# Patient Record
Sex: Male | Born: 1980 | Hispanic: No | Marital: Single | Smoking: Never smoker
Health system: Southern US, Community
[De-identification: ages and names within clinical notes are randomized; demographics above are authoritative.]

## PROBLEM LIST (undated history)

## (undated) HISTORY — PX: BACK SURGERY: SHX140

---

## 2006-06-11 ENCOUNTER — Emergency Department (HOSPITAL_COMMUNITY): Admission: EM | Admit: 2006-06-11 | Discharge: 2006-06-11 | Payer: Self-pay | Admitting: Emergency Medicine

## 2014-11-11 ENCOUNTER — Emergency Department (HOSPITAL_COMMUNITY)
Admission: EM | Admit: 2014-11-11 | Discharge: 2014-11-11 | Disposition: A | Discharge status: Home / Self Care | Payer: No Typology Code available for payment source | Attending: Emergency Medicine | Admitting: Emergency Medicine

## 2014-11-11 ENCOUNTER — Encounter (HOSPITAL_COMMUNITY): Payer: Self-pay | Admitting: *Deleted

## 2014-11-11 DIAGNOSIS — S29019A Strain of muscle and tendon of unspecified wall of thorax, initial encounter: Secondary | ICD-10-CM | POA: Diagnosis not present

## 2014-11-11 DIAGNOSIS — Y92410 Unspecified street and highway as the place of occurrence of the external cause: Secondary | ICD-10-CM | POA: Insufficient documentation

## 2014-11-11 DIAGNOSIS — T148XXA Other injury of unspecified body region, initial encounter: Secondary | ICD-10-CM

## 2014-11-11 DIAGNOSIS — Y9389 Activity, other specified: Secondary | ICD-10-CM | POA: Insufficient documentation

## 2014-11-11 DIAGNOSIS — Y998 Other external cause status: Secondary | ICD-10-CM | POA: Diagnosis not present

## 2014-11-11 DIAGNOSIS — S299XXA Unspecified injury of thorax, initial encounter: Secondary | ICD-10-CM | POA: Diagnosis present

## 2014-11-11 MED ORDER — CYCLOBENZAPRINE HCL 10 MG PO TABS
10.0000 mg | ORAL_TABLET | Freq: Once | ORAL | Status: AC
Start: 2014-11-11 — End: 2014-11-11
  Administered 2014-11-11: 10 mg via ORAL
  Filled 2014-11-11: qty 1

## 2014-11-11 MED ORDER — IBUPROFEN 800 MG PO TABS: 800.0000 mg | ORAL_TABLET | Freq: Three times a day (TID) | ORAL | Status: DC

## 2014-11-11 MED ORDER — CYCLOBENZAPRINE HCL 10 MG PO TABS: 10.0000 mg | ORAL_TABLET | Freq: Two times a day (BID) | ORAL | Status: DC | PRN

## 2014-11-11 MED ORDER — IBUPROFEN 800 MG PO TABS
800.0000 mg | ORAL_TABLET | Freq: Once | ORAL | Status: AC
  Administered 2014-11-11: 800 mg via ORAL
  Filled 2014-11-11: qty 1
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
## 2015-06-21 ENCOUNTER — Encounter (HOSPITAL_COMMUNITY): Payer: Self-pay | Admitting: Emergency Medicine

## 2015-06-21 ENCOUNTER — Emergency Department (HOSPITAL_COMMUNITY)
Admission: EM | Admit: 2015-06-21 | Discharge: 2015-06-21 | Disposition: A | Discharge status: Home / Self Care | Payer: No Typology Code available for payment source | Attending: Emergency Medicine | Admitting: Emergency Medicine

## 2015-06-21 ENCOUNTER — Emergency Department (HOSPITAL_COMMUNITY): Admission: EM | Admit: 2015-06-21 | Discharge: 2015-06-21 | Discharge status: Absent without leave | Payer: Self-pay

## 2015-06-21 ENCOUNTER — Emergency Department (HOSPITAL_COMMUNITY): Payer: No Typology Code available for payment source

## 2015-06-21 DIAGNOSIS — Y9389 Activity, other specified: Secondary | ICD-10-CM | POA: Insufficient documentation

## 2015-06-21 DIAGNOSIS — Y998 Other external cause status: Secondary | ICD-10-CM | POA: Diagnosis not present

## 2015-06-21 DIAGNOSIS — S199XXA Unspecified injury of neck, initial encounter: Secondary | ICD-10-CM | POA: Diagnosis not present

## 2015-06-21 DIAGNOSIS — Y92410 Unspecified street and highway as the place of occurrence of the external cause: Secondary | ICD-10-CM | POA: Diagnosis not present

## 2015-06-21 DIAGNOSIS — S29012A Strain of muscle and tendon of back wall of thorax, initial encounter: Secondary | ICD-10-CM | POA: Diagnosis not present

## 2015-06-21 DIAGNOSIS — S0990XA Unspecified injury of head, initial encounter: Secondary | ICD-10-CM | POA: Insufficient documentation

## 2015-06-21 DIAGNOSIS — S233XXA Sprain of ligaments of thoracic spine, initial encounter: Secondary | ICD-10-CM

## 2015-06-21 DIAGNOSIS — Z791 Long term (current) use of non-steroidal anti-inflammatories (NSAID): Secondary | ICD-10-CM | POA: Diagnosis not present

## 2015-06-21 DIAGNOSIS — S299XXA Unspecified injury of thorax, initial encounter: Secondary | ICD-10-CM | POA: Diagnosis present

## 2015-06-21 DIAGNOSIS — S239XXA Sprain of unspecified parts of thorax, initial encounter: Secondary | ICD-10-CM | POA: Insufficient documentation

## 2015-06-21 MED ORDER — CYCLOBENZAPRINE HCL 10 MG PO TABS: 10.0000 mg | ORAL_TABLET | Freq: Two times a day (BID) | ORAL | Status: DC | PRN

## 2015-06-21 MED ORDER — IBUPROFEN 800 MG PO TABS: 800.0000 mg | ORAL_TABLET | Freq: Three times a day (TID) | ORAL | Status: DC
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
## 2015-06-21 NOTE — Discharge Instructions (Signed)
Back Pain, Adult Low back pain is very common. About 1 in 5 people have back pain.The cause of low back pain is rarely dangerous. The pain often gets better over time.About half of people with a sudden onset of back pain feel better in just 2 weeks. About 8 in 10 people feel better by 6 weeks.  CAUSES Some common causes of back pain include:  Strain of the muscles or ligaments supporting the spine.  Wear and tear (degeneration) of the spinal discs.  Arthritis.  Direct injury to the back. DIAGNOSIS Most of the time, the direct cause of low back pain is not known.However, back pain can be treated effectively even when the exact cause of the pain is unknown.Answering your caregiver's questions about your overall health and symptoms is one of the most accurate ways to make sure the cause of your pain is not dangerous. If your caregiver needs more information, he or she may order lab work or imaging tests (X-rays or MRIs).However, even if imaging tests show changes in your back, this usually does not require surgery. HOME CARE INSTRUCTIONS For many people, back pain returns.Since low back pain is rarely dangerous, it is often a condition that people can learn to manageon their own.   Remain active. It is stressful on the back to sit or stand in one place. Do not sit, drive, or stand in one place for more than 30 minutes at a time. Take short walks on level surfaces as soon as pain allows.Try to increase the length of time you walk each day.  Do not stay in bed.Resting more than 1 or 2 days can delay your recovery.  Do not avoid exercise or work.Your body is made to move.It is not dangerous to be active, even though your back may hurt.Your back will likely heal faster if you return to being active before your pain is gone.  Pay attention to your body when you bend and lift. Many people have less discomfortwhen lifting if they bend their knees, keep the load close to their bodies,and  avoid twisting. Often, the most comfortable positions are those that put less stress on your recovering back.  Find a comfortable position to sleep. Use a firm mattress and lie on your side with your knees slightly bent. If you lie on your back, put a pillow under your knees.  Only take over-the-counter or prescription medicines as directed by your caregiver. Over-the-counter medicines to reduce pain and inflammation are often the most helpful.Your caregiver may prescribe muscle relaxant drugs.These medicines help dull your pain so you can more quickly return to your normal activities and healthy exercise.  Put ice on the injured area.  Put ice in a plastic bag.  Place a towel between your skin and the bag.  Leave the ice on for 15-20 minutes, 03-04 times a day for the first 2 to 3 days. After that, ice and heat may be alternated to reduce pain and spasms.  Ask your caregiver about trying back exercises and gentle massage. This may be of some benefit.  Avoid feeling anxious or stressed.Stress increases muscle tension and can worsen back pain.It is important to recognize when you are anxious or stressed and learn ways to manage it.Exercise is a great option. SEEK MEDICAL CARE IF:  You have pain that is not relieved with rest or medicine.  You have pain that does not improve in 1 week.  You have new symptoms.  You are generally not feeling well. SEEK   IMMEDIATE MEDICAL CARE IF:   You have pain that radiates from your back into your legs.  You develop new bowel or bladder control problems.  You have unusual weakness or numbness in your arms or legs.  You develop nausea or vomiting.  You develop abdominal pain.  You feel faint. Document Released: 09/14/2005 Document Revised: 03/15/2012 Document Reviewed: 01/16/2014 ExitCare Patient Information 2015 [employer]. This information is not intended to replace advice given to you by your health care provider. Make sure you  discuss any questions you have with your health care provider.  
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
## 2015-10-22 ENCOUNTER — Emergency Department (HOSPITAL_COMMUNITY)
Admission: EM | Admit: 2015-10-22 | Discharge: 2015-10-23 | Disposition: A | Discharge status: Home / Self Care | Payer: Self-pay | Attending: Emergency Medicine | Admitting: Emergency Medicine

## 2015-10-22 ENCOUNTER — Encounter (HOSPITAL_COMMUNITY): Payer: Self-pay | Admitting: *Deleted

## 2015-10-22 DIAGNOSIS — H538 Other visual disturbances: Secondary | ICD-10-CM | POA: Insufficient documentation

## 2015-10-22 DIAGNOSIS — E860 Dehydration: Secondary | ICD-10-CM | POA: Insufficient documentation

## 2015-10-22 LAB — CBG MONITORING, ED: GLUCOSE-CAPILLARY: 93 mg/dL (ref 65–99)
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
## 2015-10-23 LAB — CBC
HEMATOCRIT: 40 % (ref 39.0–52.0)
HEMOGLOBIN: 13.1 g/dL (ref 13.0–17.0)
MCH: 28.6 pg (ref 26.0–34.0)
MCHC: 32.8 g/dL (ref 30.0–36.0)
MCV: 87.3 fL (ref 78.0–100.0)
Platelets: 221 10*3/uL (ref 150–400)
RBC: 4.58 MIL/uL (ref 4.22–5.81)
RDW: 13 % (ref 11.5–15.5)
WBC: 9 10*3/uL (ref 4.0–10.5)

## 2015-10-23 LAB — URINE MICROSCOPIC-ADD ON: Squamous Epithelial / LPF: NONE SEEN

## 2015-10-23 LAB — BASIC METABOLIC PANEL
ANION GAP: 9 (ref 5–15)
BUN: 22 mg/dL — ABNORMAL HIGH (ref 6–20)
CALCIUM: 8.6 mg/dL — AB (ref 8.9–10.3)
CO2: 25 mmol/L (ref 22–32)
Chloride: 105 mmol/L (ref 101–111)
Creatinine, Ser: 0.87 mg/dL (ref 0.61–1.24)
GFR calc Af Amer: >60 mL/min (ref >60)
GFR calc non Af Amer: >60 mL/min (ref >60)
GLUCOSE: 99 mg/dL (ref 65–99)
Potassium: 3.7 mmol/L (ref 3.5–5.1)
Sodium: 139 mmol/L (ref 135–145)

## 2015-10-23 LAB — URINALYSIS, ROUTINE W REFLEX MICROSCOPIC
BILIRUBIN URINE: NEGATIVE
Glucose, UA: NEGATIVE mg/dL
HGB URINE DIPSTICK: NEGATIVE
Ketones, ur: NEGATIVE mg/dL
Leukocytes, UA: NEGATIVE
Nitrite: NEGATIVE
PH: 7 (ref 5.0–8.0)
Protein, ur: 30 mg/dL — AB
Specific Gravity, Urine: 1.038 — ABNORMAL HIGH (ref 1.005–1.030)

## 2015-10-23 MED ORDER — SODIUM CHLORIDE 0.9 % IV BOLUS (SEPSIS)
1000.0000 mL | Freq: Once | INTRAVENOUS | Status: AC
  Administered 2015-10-23: 1000 mL via INTRAVENOUS

## 2015-10-23 NOTE — ED Notes (Signed)
PA at bedside.

## 2015-10-23 NOTE — Discharge Instructions (Signed)

## 2015-10-23 NOTE — ED Provider Notes (Signed)
CSN: [id number]     Arrival date & time 10/22/15  2336 History   First MD Initiated Contact with Patient 10/22/15 2345     Chief Complaint  Patient presents with  . Loss of Consciousness     (Consider location/radiation/quality/duration/timing/severity/associated sxs/prior Treatment) HPI    Patient with no significant past medical history comes to the emergency department by private vehicle for an episode of feeling dizzy at work. The patient says that he drank mostly cool-Aid today, no water and had no food until about 4:30 PM where he ate a large amount of lamb, rice, bread. 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 The patient states he knows how long it lasted because he never completely passed out, he said he felt like his going to. He said this in front of RN Tresa Endo.  He states that he made over to the kitchen and had some lemonade to drink at which point his symptoms largely improved. He finished counting the money at work and then came to the emergency department to get checked out. He currently is completely asymptomatic. He denies anything like this is happening before. He had no point had any headache, chest pain, abdominal pain, back pain, fever, N/V/D, neck pain.   History reviewed. No pertinent past medical history. History reviewed. No pertinent past surgical history. No family history on file. Social History  Substance Use Topics  . Smoking status: Never Smoker   . Smokeless tobacco: None  . Alcohol Use: No    Review of Systems  Review of Systems All other systems negative except as documented in the HPI. All pertinent positives and negatives as reviewed in the HPI.   Allergies  Review of patient's allergies indicates no known allergies.  Home Medications   Prior to Admission medications   Medication Sig Start Date End Date Taking? Authorizing Provider   ibuprofen (ADVIL,MOTRIN) 200 MG tablet Take 400 mg by mouth every 6 (six) hours as needed for headache, mild pain or moderate pain.   Yes Historical Provider, MD  cyclobenzaprine (FLEXERIL) 10 MG tablet Take 1 tablet (10 mg total) by mouth 2 (two) times daily as needed for muscle spasms. Patient not taking: Reported on 10/22/2015 06/21/15   Teressa Lower, NP  ibuprofen (ADVIL,MOTRIN) 800 MG tablet Take 1 tablet (800 mg total) by mouth 3 (three) times daily. Patient not taking: Reported on 10/22/2015 06/21/15   Teressa Lower, NP   BP 116/71 mmHg  Pulse 77  Temp(Src) 98.7 F (37.1 C) (Oral)  Resp 23  Ht  (1.803 m)  Wt 86.183 kg  BMI 26.51 kg/m2  SpO2 98% Physical Exam  Constitutional: He appears well-developed and well-nourished. No distress.  HENT:  Head: Normocephalic and atraumatic. Head is without raccoon's eyes, without Battle's sign, without abrasion, without contusion, without right periorbital erythema and without left periorbital erythema.  Right Ear: Tympanic membrane and ear canal normal.  Left Ear: Tympanic membrane and ear canal normal.  Nose: Nose normal.  Mouth/Throat: Uvula is midline, oropharynx is clear and moist and mucous membranes are normal.  Eyes: Pupils are equal, round, and reactive to light.  Neck: Normal range of motion. Neck supple.  Cardiovascular: Normal rate and regular rhythm.   Pulmonary/Chest: Effort normal.  Abdominal: Soft.  No signs of abdominal distention  Musculoskeletal:  No LE swelling  Neurological: He is alert.  Cranial nerves  grossly intact on exam. Pt alert and oriented x 3 Upper and lower extremity strength is symmetrical and physiologic Normal muscular tone No facial droop Coordination intact, no limb ataxia, No pronator drift   Skin: Skin is warm and dry. No rash noted.  Nursing note and vitals reviewed.   ED Course  Procedures (including critical care time) Labs Review Labs Reviewed  BASIC METABOLIC PANEL -  Abnormal; Notable for the following:    BUN 22 (*)    Calcium 8.6 (*)    All other components within normal limits  URINALYSIS, ROUTINE W REFLEX MICROSCOPIC (NOT AT [facility]) - Abnormal; Notable for the following:    Specific Gravity, Urine 1.038 (*)    Protein, ur 30 (*)    All other components within normal limits  URINE MICROSCOPIC-ADD ON - Abnormal; Notable for the following:    Bacteria, UA FEW (*)    All other components within normal limits  CBC  CBG MONITORING, ED  CBG MONITORING, ED    Imaging Review No results found. I have personally reviewed and evaluated these images and lab results as part of my medical decision-making.   EKG Interpretation None        MDM   Final diagnoses:  Dehydration    00:14:20 Orthostatic Vital Signs KG  Orthostatic Lying  - BP- Lying: 122/73 mmHg ; Pulse- Lying: 72  Orthostatic Sitting - BP- Sitting: 124/91 mmHg ; Pulse- Sitting: 80  Orthostatic Standing at 0 minutes - BP- Standing at 0 minutes: 114/92 mmHg ; Pulse- Standing at 0 minutes: 91     Patient is orthostatic, his urine is very dark, his BUN is elevated 22. The patient has been given a liter of fluid to treat dehydration. Otherwise his CBG, EKG and labs are unremarkable and I do not feel that he needs any further workup or treatments at this time. The patient is comfortable with this plan and had originally been asking to leave before fluids had been initiated but was willing to stay to complete them. He is happy and pleasant be around. He is to be driving himself home. Patient advised to follow-up with primary care doctor and given strict return to the emergency department precautions. Discussed eating 3 times a day and drink plenty off fluids during the day.  Marlon Pel, PA-C 10/23/15 1191  Geoffery Lyons, MD 10/23/15 [phone]

## 2016-04-01 ENCOUNTER — Emergency Department (HOSPITAL_COMMUNITY)
Admission: EM | Admit: 2016-04-01 | Discharge: 2016-04-01 | Disposition: A | Discharge status: Home / Self Care | Payer: No Typology Code available for payment source | Attending: Emergency Medicine | Admitting: Emergency Medicine

## 2016-04-01 ENCOUNTER — Encounter (HOSPITAL_COMMUNITY): Payer: Self-pay

## 2016-04-01 DIAGNOSIS — M545 Low back pain, unspecified: Secondary | ICD-10-CM

## 2016-04-01 DIAGNOSIS — Y92410 Unspecified street and highway as the place of occurrence of the external cause: Secondary | ICD-10-CM | POA: Insufficient documentation

## 2016-04-01 DIAGNOSIS — Y939 Activity, unspecified: Secondary | ICD-10-CM | POA: Insufficient documentation

## 2016-04-01 DIAGNOSIS — S3992XA Unspecified injury of lower back, initial encounter: Secondary | ICD-10-CM | POA: Diagnosis present

## 2016-04-01 DIAGNOSIS — T148XXA Other injury of unspecified body region, initial encounter: Secondary | ICD-10-CM

## 2016-04-01 DIAGNOSIS — S39012A Strain of muscle, fascia and tendon of lower back, initial encounter: Secondary | ICD-10-CM | POA: Insufficient documentation

## 2016-04-01 DIAGNOSIS — Y999 Unspecified external cause status: Secondary | ICD-10-CM | POA: Diagnosis not present

## 2016-04-01 MED ORDER — IBUPROFEN 800 MG PO TABS: 800.0000 mg | ORAL_TABLET | Freq: Three times a day (TID) | ORAL | Status: DC

## 2016-04-01 MED ORDER — CYCLOBENZAPRINE HCL 10 MG PO TABS: 10.0000 mg | ORAL_TABLET | Freq: Two times a day (BID) | ORAL | Status: DC | PRN

## 2016-04-01 NOTE — Discharge Instructions (Signed)
Back Pain, Adult °Back pain is very common in adults. The cause of back pain is rarely dangerous and the pain often gets better over time. The cause of your back pain may not be known. Some common causes of back pain include: °· Strain of the muscles or ligaments supporting the spine. °· Wear and tear (degeneration) of the spinal disks. °· Arthritis. °· Direct injury to the back. °For many people, back pain may return. Since back pain is rarely dangerous, most people can learn to manage this condition on their own. °HOME CARE INSTRUCTIONS °Watch your back pain for any changes. The following actions may help to lessen any discomfort you are feeling: °· Remain active. It is stressful on your back to sit or stand in one place for long periods of time. Do not sit, drive, or stand in one place for more than 30 minutes at a time. Take short walks on even surfaces as soon as you are able. Try to increase the length of time you walk each day. °· Exercise regularly as directed by your health care provider. Exercise helps your back heal faster. It also helps avoid future injury by keeping your muscles strong and flexible. °· Do not stay in bed. Resting more than 1-2 days can delay your recovery. °· Pay attention to your body when you bend and lift. The most comfortable positions are those that put less stress on your recovering back. Always use proper lifting techniques, including: °¨ Bending your knees. °¨ Keeping the load close to your body. °¨ Avoiding twisting. °· Find a comfortable position to sleep. Use a firm mattress and lie on your side with your knees slightly bent. If you lie on your back, put a pillow under your knees. °· Avoid feeling anxious or stressed. Stress increases muscle tension and can worsen back pain. It is important to recognize when you are anxious or stressed and learn ways to manage it, such as with exercise. °· Take medicines only as directed by your health care provider. Over-the-counter  medicines to reduce pain and inflammation are often the most helpful. Your health care provider may prescribe muscle relaxant drugs. These medicines help dull your pain so you can more quickly return to your normal activities and healthy exercise. °· Apply ice to the injured area: °¨ Put ice in a plastic bag. °¨ Place a towel between your skin and the bag. °¨ Leave the ice on for 20 minutes, 2-3 times a day for the first 2-3 days. After that, ice and heat may be alternated to reduce pain and spasms. °· Maintain a healthy weight. Excess weight puts extra stress on your back and makes it difficult to maintain good posture. °SEEK MEDICAL CARE IF: °· You have pain that is not relieved with rest or medicine. °· You have increasing pain going down into the legs or buttocks. °· You have pain that does not improve in one week. °· You have night pain. °· You lose weight. °· You have a fever or chills. °SEEK IMMEDIATE MEDICAL CARE IF:  °· You develop new bowel or bladder control problems. °· You have unusual weakness or numbness in your arms or legs. °· You develop nausea or vomiting. °· You develop abdominal pain. °· You feel faint. °  °This information is not intended to replace advice given to you by your health care provider. Make sure you discuss any questions you have with your health care provider. °  °Document Released: 09/14/2005 Document Revised: 10/05/2014 Document Reviewed: 01/16/2014 °Elsevier Interactive Patient Education ©2016 Elsevier   Inc. Muscle Strain A muscle strain is an injury that occurs when a muscle is stretched beyond its normal length. Usually a small number of muscle fibers are torn when this happens. Muscle strain is rated in degrees. First-degree strains have the least amount of muscle fiber tearing and pain. Second-degree and third-degree strains have increasingly more tearing and pain.  Usually, recovery from muscle strain takes 1-2 weeks. Complete healing takes 5-6 weeks.  CAUSES  Muscle  strain happens when a sudden, violent force placed on a muscle stretches it too far. This may occur with lifting, sports, or a fall.  RISK FACTORS Muscle strain is especially common in athletes.  SIGNS AND SYMPTOMS At the site of the muscle strain, there may be:  Pain.  Bruising.  Swelling.  Difficulty using the muscle due to pain or lack of normal function. DIAGNOSIS  Your health care provider will perform a physical exam and ask about your medical history. TREATMENT  Often, the best treatment for a muscle strain is resting, icing, and applying cold compresses to the injured area.  HOME CARE INSTRUCTIONS   Use the PRICE method of treatment to promote muscle healing during the first 2-3 days after your injury. The PRICE method involves:  Protecting the muscle from being injured again.  Restricting your activity and resting the injured body part.  Icing your injury. To do this, put ice in a plastic bag. Place a towel between your skin and the bag. Then, apply the ice and leave it on from 15-20 minutes each hour. After the third day, switch to moist heat packs.  Apply compression to the injured area with a splint or elastic bandage. Be careful not to wrap it too tightly. This may interfere with blood circulation or increase swelling.  Elevate the injured body part above the level of your heart as often as you can.  Only take over-the-counter or prescription medicines for pain, discomfort, or fever as directed by your health care provider.  Warming up prior to exercise helps to prevent future muscle strains. SEEK MEDICAL CARE IF:   You have increasing pain or swelling in the injured area.  You have numbness, tingling, or a significant loss of strength in the injured area. MAKE SURE YOU:   Understand these instructions.  Will watch your condition.  Will get help right away if you are not doing well or get worse.   This information is not intended to replace advice given to  you by your health care provider. Make sure you discuss any questions you have with your health care provider.   Document Released: 09/14/2005 Document Revised: 07/05/2013 Document Reviewed: 04/13/2013 Elsevier Interactive Patient Education 2016 Elsevier Inc. Cryotherapy Cryotherapy means treatment with cold. Ice or gel packs can be used to reduce both pain and swelling. Ice is the most helpful within the first 24 to 48 hours after an injury or flare-up from overusing a muscle or joint. Sprains, strains, spasms, burning pain, shooting pain, and aches can all be eased with ice. Ice can also be used when recovering from surgery. Ice is effective, has very few side effects, and is safe for most people to use. PRECAUTIONS  Ice is not a safe treatment option for people with:  Raynaud phenomenon. This is a condition affecting small blood vessels in the extremities. Exposure to cold may cause your problems to return.  Cold hypersensitivity. There are many forms of cold hypersensitivity, including:  Cold urticaria. Red, itchy hives appear on the skin when  the tissues begin to warm after being iced.  Cold erythema. This is a red, itchy rash caused by exposure to cold.  Cold hemoglobinuria. Red blood cells break down when the tissues begin to warm after being iced. The hemoglobin that carry oxygen are passed into the urine because they cannot combine with blood proteins fast enough.  Numbness or altered sensitivity in the area being iced. If you have any of the following conditions, do not use ice until you have discussed cryotherapy with your caregiver:  Heart conditions, such as arrhythmia, angina, or chronic heart disease.  High blood pressure.  Healing wounds or open skin in the area being iced.  Current infections.  Rheumatoid arthritis.  Poor circulation.  Diabetes. Ice slows the blood flow in the region it is applied. This is beneficial when trying to stop inflamed tissues from  spreading irritating chemicals to surrounding tissues. However, if you expose your skin to cold temperatures for too long or without the proper protection, you can damage your skin or nerves. Watch for signs of skin damage due to cold. HOME CARE INSTRUCTIONS Follow these tips to use ice and cold packs safely.  Place a dry or damp towel between the ice and skin. A damp towel will cool the skin more quickly, so you may need to shorten the time that the ice is used.  For a more rapid response, add gentle compression to the ice.  Ice for no more than 10 to 20 minutes at a time. The bonier the area you are icing, the less time it will take to get the benefits of ice.  Check your skin after 5 minutes to make sure there are no signs of a poor response to cold or skin damage.  Rest 20 minutes or more between uses.  Once your skin is numb, you can end your treatment. You can test numbness by very lightly touching your skin. The touch should be so light that you do not see the skin dimple from the pressure of your fingertip. When using ice, most people will feel these normal sensations in this order: cold, burning, aching, and numbness.  Do not use ice on someone who cannot communicate their responses to pain, such as small children or people with dementia. HOW TO MAKE AN ICE PACK Ice packs are the most common way to use ice therapy. Other methods include ice massage, ice baths, and cryosprays. Muscle creams that cause a cold, tingly feeling do not offer the same benefits that ice offers and should not be used as a substitute unless recommended by your caregiver. To make an ice pack, do one of the following:  Place crushed ice or a bag of frozen vegetables in a sealable plastic bag. Squeeze out the excess air. Place this bag inside another plastic bag. Slide the bag into a pillowcase or place a damp towel between your skin and the bag.  Mix 3 parts water with 1 part rubbing alcohol. Freeze the mixture  in a sealable plastic bag. When you remove the mixture from the freezer, it will be slushy. Squeeze out the excess air. Place this bag inside another plastic bag. Slide the bag into a pillowcase or place a damp towel between your skin and the bag. SEEK MEDICAL CARE IF:  You develop white spots on your skin. This may give the skin a blotchy (mottled) appearance.  Your skin turns blue or pale.  Your skin becomes waxy or hard.  Your swelling gets worse. MAKE  SURE YOU:   Understand these instructions.  Will watch your condition.  Will get help right away if you are not doing well or get worse.   This information is not intended to replace advice given to you by your health care provider. Make sure you discuss any questions you have with your health care provider.   Document Released: 05/11/2011 Document Revised: 10/05/2014 Document Reviewed: 05/11/2011 Elsevier Interactive Patient Education 2016 [employer] Tourist information centre managerMotor Vehicle Collision It is common to have multiple bruises and sore muscles after a motor vehicle collision (MVC). These tend to feel worse for the first 24 hours. You may have the most stiffness and soreness over the first several hours. You may also feel worse when you wake up the first morning after your collision. After this point, you will usually begin to improve with each day. The speed of improvement often depends on the severity of the collision, the number of injuries, and the location and nature of these injuries. HOME CARE INSTRUCTIONS  Put ice on the injured area.  Put ice in a plastic bag.  Place a towel between your skin and the bag.  Leave the ice on for 15-20 minutes, 3-4 times a day, or as directed by your health care provider.  Drink enough fluids to keep your urine clear or pale yellow. Do not drink alcohol.  Take a warm shower or bath once or twice a day. This will increase blood flow to sore muscles.  You may return to activities as directed by your  caregiver. Be careful when lifting, as this may aggravate neck or back pain.  Only take over-the-counter or prescription medicines for pain, discomfort, or fever as directed by your caregiver. Do not use aspirin. This may increase bruising and bleeding. SEEK IMMEDIATE MEDICAL CARE IF:  You have numbness, tingling, or weakness in the arms or legs.  You develop severe headaches not relieved with medicine.  You have severe neck pain, especially tenderness in the middle of the back of your neck.  You have changes in bowel or bladder control.  There is increasing pain in any area of the body.  You have shortness of breath, light-headedness, dizziness, or fainting.  You have chest pain.  You feel sick to your stomach (nauseous), throw up (vomit), or sweat.  You have increasing abdominal discomfort.  There is blood in your urine, stool, or vomit.  You have pain in your shoulder (shoulder strap areas).  You feel your symptoms are getting worse. MAKE SURE YOU:  Understand these instructions.  Will watch your condition.  Will get help right away if you are not doing well or get worse.   This information is not intended to replace advice given to you by your health care provider. Make sure you discuss any questions you have with your health care provider.   Document Released: 09/14/2005 Document Revised: 10/05/2014 Document Reviewed: 02/11/2011 Elsevier Interactive Patient Education [employer] Elsevier Inc.
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
## 2016-08-21 ENCOUNTER — Encounter (HOSPITAL_COMMUNITY): Payer: Self-pay

## 2017-10-22 ENCOUNTER — Encounter (HOSPITAL_COMMUNITY): Payer: Self-pay | Admitting: Emergency Medicine

## 2017-10-22 ENCOUNTER — Other Ambulatory Visit: Payer: Self-pay

## 2017-10-22 ENCOUNTER — Emergency Department (HOSPITAL_COMMUNITY)
Admission: EM | Admit: 2017-10-22 | Discharge: 2017-10-23 | Disposition: A | Discharge status: Home / Self Care | Payer: Self-pay | Attending: Emergency Medicine | Admitting: Emergency Medicine

## 2017-10-22 DIAGNOSIS — J029 Acute pharyngitis, unspecified: Secondary | ICD-10-CM | POA: Insufficient documentation

## 2017-10-22 DIAGNOSIS — R509 Fever, unspecified: Secondary | ICD-10-CM | POA: Insufficient documentation

## 2017-10-22 DIAGNOSIS — R4702 Dysphasia: Secondary | ICD-10-CM | POA: Insufficient documentation

## 2017-10-22 LAB — MONONUCLEOSIS SCREEN: Mono Screen: NEGATIVE

## 2017-10-22 LAB — CBC WITH DIFFERENTIAL/PLATELET
BASOS PCT: 0 %
Basophils Absolute: 0 10*3/uL (ref 0.0–0.1)
EOS PCT: 1 %
Eosinophils Absolute: 0.1 10*3/uL (ref 0.0–0.7)
HEMATOCRIT: 40.4 % (ref 39.0–52.0)
Hemoglobin: 13.4 g/dL (ref 13.0–17.0)
Lymphocytes Relative: 18 %
Lymphs Abs: 2 10*3/uL (ref 0.7–4.0)
MCH: 28.8 pg (ref 26.0–34.0)
MCHC: 33.2 g/dL (ref 30.0–36.0)
MCV: 86.7 fL (ref 78.0–100.0)
MONO ABS: 1.3 10*3/uL — AB (ref 0.1–1.0)
MONOS PCT: 12 %
NEUTROS ABS: 7.7 10*3/uL (ref 1.7–7.7)
Neutrophils Relative %: 69 %
PLATELETS: 205 10*3/uL (ref 150–400)
RBC: 4.66 MIL/uL (ref 4.22–5.81)
RDW: 13.1 % (ref 11.5–15.5)
WBC: 11 10*3/uL — ABNORMAL HIGH (ref 4.0–10.5)

## 2017-10-22 LAB — COMPREHENSIVE METABOLIC PANEL
ALBUMIN: 4.1 g/dL (ref 3.5–5.0)
ALK PHOS: 69 U/L (ref 38–126)
ALT: 73 U/L — AB (ref 17–63)
ANION GAP: 9 (ref 5–15)
AST: 39 U/L (ref 15–41)
BILIRUBIN TOTAL: 0.8 mg/dL (ref 0.3–1.2)
BUN: 18 mg/dL (ref 6–20)
CALCIUM: 9.3 mg/dL (ref 8.9–10.3)
CO2: 27 mmol/L (ref 22–32)
CREATININE: 0.96 mg/dL (ref 0.61–1.24)
Chloride: 101 mmol/L (ref 101–111)
GFR calc Af Amer: >60 mL/min (ref >60)
GFR calc non Af Amer: >60 mL/min (ref >60)
GLUCOSE: 105 mg/dL — AB (ref 65–99)
Potassium: 4.1 mmol/L (ref 3.5–5.1)
SODIUM: 137 mmol/L (ref 135–145)
Total Protein: 8.1 g/dL (ref 6.5–8.1)

## 2017-10-22 LAB — INFLUENZA PANEL BY PCR (TYPE A & B)
INFLAPCR: NEGATIVE
Influenza B By PCR: NEGATIVE

## 2017-10-22 LAB — I-STAT CG4 LACTIC ACID, ED: Lactic Acid, Venous: 0.83 mmol/L (ref 0.5–1.9)

## 2017-10-22 LAB — RAPID STREP SCREEN (MED CTR MEBANE ONLY): Streptococcus, Group A Screen (Direct): NEGATIVE

## 2017-10-22 MED ORDER — GI COCKTAIL ~~LOC~~
30.0000 mL | Freq: Once | ORAL | Status: AC
  Administered 2017-10-22: 30 mL via ORAL
  Filled 2017-10-22: qty 30

## 2017-10-22 MED ORDER — KETOROLAC TROMETHAMINE 60 MG/2ML IM SOLN
60.0000 mg | Freq: Once | INTRAMUSCULAR | Status: AC
  Administered 2017-10-22: 60 mg via INTRAMUSCULAR
  Filled 2017-10-22: qty 2

## 2017-10-22 MED ORDER — ACETAMINOPHEN 500 MG PO TABS: 1000.0000 mg | ORAL_TABLET | Freq: Three times a day (TID) | ORAL | 0 refills | Status: DC | PRN

## 2017-10-22 MED ORDER — SODIUM CHLORIDE 0.9 % IV BOLUS (SEPSIS): 1000.0000 mL | Freq: Once | INTRAVENOUS | Status: DC

## 2017-10-22 MED ORDER — ACETAMINOPHEN 325 MG PO TABS
650.0000 mg | ORAL_TABLET | Freq: Once | ORAL | Status: AC | PRN
  Administered 2017-10-22: 650 mg via ORAL
  Filled 2017-10-22: qty 2

## 2017-10-22 MED ORDER — IBUPROFEN 600 MG PO TABS: 600.0000 mg | ORAL_TABLET | Freq: Four times a day (QID) | ORAL | 0 refills | Status: AC | PRN

## 2017-10-22 MED ORDER — MAGIC MOUTHWASH W/LIDOCAINE: 5.0000 mL | Freq: Four times a day (QID) | ORAL | 0 refills | Status: DC | PRN

## 2017-10-22 MED ORDER — DEXAMETHASONE SODIUM PHOSPHATE 10 MG/ML IJ SOLN
10.0000 mg | Freq: Once | INTRAMUSCULAR | Status: AC
  Administered 2017-10-22: 10 mg via INTRAMUSCULAR
  Filled 2017-10-22: qty 1

## 2017-10-22 NOTE — ED Provider Notes (Signed)
[facility]-EMERGENCY DEPT Provider Note   CSN: [id number] Arrival date & time: 10/22/17  1545    History   Chief Complaint Chief Complaint  Patient presents with  . Sore Throat    HPI Francisco Cook is a 37 y.o. male.   37 year old male with no significant past medical history presents to the emergency department for evaluation of sore throat.  Sore throat has been present for the past 2-3 days.  He notes worsening symptoms with associated chills.  He has tried Cepacol drops and drinking warm fluid without improvement.  He was noted to be febrile up to 103.46F in the emergency department.  He denies any inability to tolerate food or fluids.  He has been able to tolerate secretions.  He does report dysphasia with oral intake.  No cough, congestion, shortness of breath, nausea, vomiting, body aches.  He denies any sick contacts; does not have any children.     History reviewed. No pertinent past medical history.  There are no active problems to display for this patient.   Past Surgical History:  Procedure Laterality Date  . BACK SURGERY         Home Medications    Prior to Admission medications   Medication Sig Start Date End Date Taking? Authorizing Provider  acetaminophen (TYLENOL) 500 MG tablet Take 2 tablets (1,000 mg total) by mouth every 8 (eight) hours as needed for fever. 10/22/17   Antony MaduraHumes, Caylee Vlachos, PA-C  cyclobenzaprine (FLEXERIL) 10 MG tablet Take 1 tablet (10 mg total) by mouth 2 (two) times daily as needed for muscle spasms. Patient not taking: Reported on 10/22/2015 06/21/15   Teressa LowerPickering, Vrinda, NP  ibuprofen (ADVIL,MOTRIN) 600 MG tablet Take 1 tablet (600 mg total) by mouth every 6 (six) hours as needed for mild pain or moderate pain. 10/22/17   Antony MaduraHumes, Kamie Korber, PA-C  magic mouthwash w/lidocaine SOLN Take 5 mLs by mouth 4 (four) times daily as needed (sore throat). Gargle and swallow as prescribed 10/22/17   Antony MaduraHumes, Rashad Auld, PA-C    Family  History History reviewed. No pertinent family history.  Social History Social History   Tobacco Use  . Smoking status: Never Smoker  . Smokeless tobacco: Never Used  Substance Use Topics  . Alcohol use: No  . Drug use: No     Allergies   Patient has no known allergies.   Review of Systems Review of Systems Ten systems reviewed and are negative for acute change, except as noted in the HPI.    Physical Exam Updated Vital Signs BP 135/90   Pulse 88   Temp 98.7 F (37.1 C) (Oral)   Resp 15   SpO2 100%   Physical Exam  Constitutional: He is oriented to person, place, and time. He appears well-developed and well-nourished. No distress.  Nontoxic appearing and in no acute distress.  Pleasant.  HENT:  Head: Normocephalic and atraumatic.  Mild posterior oropharyngeal erythema.  No edema.  Uvula midline.  No tonsillar exudates or significant enlargement.  Patient tolerating secretions without difficulty.  No tripoding.  Eyes: Conjunctivae and EOM are normal. No scleral icterus.  Neck: Normal range of motion.  No nuchal rigidity or meningismus. Tender bilateral tonsillar adenopathy; mild.  Cardiovascular: Normal rate, regular rhythm and intact distal pulses.  Pulmonary/Chest: Effort normal. No stridor. No respiratory distress. He has no wheezes.  Lungs clear to auscultation bilaterally.  Respirations even and unlabored.  Musculoskeletal: Normal range of motion.  Lymphadenopathy:    He has cervical adenopathy.  Neurological: He is alert and oriented to person, place, and time. He exhibits normal muscle tone. Coordination normal.  GCS 15.  Patient moving all extremities.  Ambulatory with steady gait.  Skin: Skin is warm and dry. No rash noted. He is not diaphoretic. No erythema. No pallor.  Psychiatric: He has a normal mood and affect. His behavior is normal.  Nursing note and vitals reviewed.    ED Treatments / Results  Labs (all labs ordered are listed, but only  abnormal results are displayed) Labs Reviewed  COMPREHENSIVE METABOLIC PANEL - Abnormal; Notable for the following components:      Result Value   Glucose, Bld 105 (*)    ALT 73 (*)    All other components within normal limits  CBC WITH DIFFERENTIAL/PLATELET - Abnormal; Notable for the following components:   WBC 11.0 (*)    Monocytes Absolute 1.3 (*)    All other components within normal limits  RAPID STREP SCREEN (NOT AT [facility])  CULTURE, GROUP A STREP [facility])  MONONUCLEOSIS SCREEN  INFLUENZA PANEL BY PCR (TYPE A & B)  I-STAT CG4 LACTIC ACID, ED    EKG  EKG Interpretation None       Radiology No results found.  Procedures Procedures (including critical care time)  Medications Ordered in ED Medications  acetaminophen (TYLENOL) tablet 650 mg (650 mg Oral Given 10/22/17 1703)  ketorolac (TORADOL) injection 60 mg (60 mg Intramuscular Given 10/22/17 2229)  dexamethasone (DECADRON) injection 10 mg (10 mg Intramuscular Given 10/22/17 2228)  gi cocktail (Maalox,Lidocaine,Donnatal) (30 mLs Oral Given 10/22/17 2225)     Initial Impression / Assessment and Plan / ED Course  I have reviewed the triage vital signs and the nursing notes.  Pertinent labs & imaging results that were available during my care of the patient were reviewed by me and considered in my medical decision making (see chart for details).     Patient presents with mild cervical lymphadenopathy and dysphagia; diagnosis of viral pharyngitis as strep screen negative today.  He was febrile on arrival.  This responded appropriately to antipyretics.  Presentation not concerning for PTA or infxn spread to soft tissue.  No trismus or uvula deviation.  He exhibits no tripoding or voice muffling.  Tolerating secretions.  Patient reports improvement to symptoms and pain after IM Toradol and Decadron.  Also given GI cocktail which has significantly improved pain, after which time patient tolerating PO fluids without difficulty or  discomfort.  Influenza and Strep culture pending at time of discharge.  Return precautions discussed and provided. Patient discharged in stable condition with no unaddressed concerns.   Final Clinical Impressions(s) / ED Diagnoses   Final diagnoses:  Febrile illness  Acute pharyngitis, unspecified etiology    ED Discharge Orders        Ordered    ibuprofen (ADVIL,MOTRIN) 600 MG tablet  Every 6 hours PRN     10/22/17 2344    magic mouthwash w/lidocaine SOLN  4 times daily PRN     10/22/17 2344    acetaminophen (TYLENOL) 500 MG tablet  Every 8 hours PRN     10/22/17 2344       Antony Madura, PA-C 10/23/17 0330    Jacalyn Lefevre, MD 10/23/17 2218

## 2017-10-22 NOTE — ED Notes (Signed)
Patient requesting orange juice for PO challenge. Provided juice for patient.

## 2017-10-22 NOTE — ED Triage Notes (Signed)
Patient c/o sore throat for couple days with chills. Reports hasnt taken any tylenol in couple days.

## 2017-10-25 LAB — CULTURE, GROUP A STREP (THRC)

## 2018-04-30 ENCOUNTER — Emergency Department (HOSPITAL_COMMUNITY)
Admission: EM | Admit: 2018-04-30 | Discharge: 2018-04-30 | Disposition: A | Discharge status: Home / Self Care | Payer: No Typology Code available for payment source | Attending: Emergency Medicine | Admitting: Emergency Medicine

## 2018-04-30 ENCOUNTER — Emergency Department (HOSPITAL_COMMUNITY): Payer: No Typology Code available for payment source

## 2018-04-30 ENCOUNTER — Encounter (HOSPITAL_COMMUNITY): Payer: Self-pay | Admitting: Emergency Medicine

## 2018-04-30 ENCOUNTER — Other Ambulatory Visit: Payer: Self-pay

## 2018-04-30 DIAGNOSIS — R55 Syncope and collapse: Secondary | ICD-10-CM | POA: Diagnosis not present

## 2018-04-30 DIAGNOSIS — Y92410 Unspecified street and highway as the place of occurrence of the external cause: Secondary | ICD-10-CM | POA: Insufficient documentation

## 2018-04-30 DIAGNOSIS — S0993XA Unspecified injury of face, initial encounter: Secondary | ICD-10-CM | POA: Diagnosis present

## 2018-04-30 DIAGNOSIS — S0081XA Abrasion of other part of head, initial encounter: Secondary | ICD-10-CM | POA: Insufficient documentation

## 2018-04-30 DIAGNOSIS — R519 Headache, unspecified: Secondary | ICD-10-CM

## 2018-04-30 DIAGNOSIS — Y9389 Activity, other specified: Secondary | ICD-10-CM | POA: Diagnosis not present

## 2018-04-30 DIAGNOSIS — Y999 Unspecified external cause status: Secondary | ICD-10-CM | POA: Insufficient documentation

## 2018-04-30 DIAGNOSIS — M542 Cervicalgia: Secondary | ICD-10-CM | POA: Insufficient documentation

## 2018-04-30 DIAGNOSIS — R51 Headache: Secondary | ICD-10-CM | POA: Insufficient documentation

## 2018-04-30 MED ORDER — ACETAMINOPHEN 325 MG PO TABS
650.0000 mg | ORAL_TABLET | Freq: Once | ORAL | Status: AC
Start: 2018-04-30 — End: 2018-04-30
  Administered 2018-04-30: 650 mg via ORAL
  Filled 2018-04-30: qty 2

## 2018-04-30 MED ORDER — CYCLOBENZAPRINE HCL 10 MG PO TABS: 10.0000 mg | ORAL_TABLET | Freq: Every day | ORAL | 0 refills | Status: DC
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
## 2018-04-30 NOTE — Discharge Instructions (Signed)
Take NSAIDs or Tylenol as needed for the next week. Take this medicine with food. °Take muscle relaxer at bedtime to help you sleep. This medicine makes you drowsy so do not take before driving or work °Use a heating pad for sore muscles - use for 20 minutes several times a day °Return for worsening symptoms ° °

## 2018-04-30 NOTE — ED Notes (Signed)
Pt discharged from ED; instructions provided and scripts given; Pt encouraged to return to ED if symptoms worsen and to f/u with PCP; Pt verbalized understanding of all instructions 

## 2018-10-12 ENCOUNTER — Emergency Department (HOSPITAL_COMMUNITY)
Admission: EM | Admit: 2018-10-12 | Discharge: 2018-10-12 | Disposition: A | Discharge status: Home / Self Care | Payer: No Typology Code available for payment source | Attending: Emergency Medicine | Admitting: Emergency Medicine

## 2018-10-12 ENCOUNTER — Emergency Department (HOSPITAL_COMMUNITY): Payer: No Typology Code available for payment source

## 2018-10-12 ENCOUNTER — Other Ambulatory Visit: Payer: Self-pay

## 2018-10-12 ENCOUNTER — Encounter (HOSPITAL_COMMUNITY): Payer: Self-pay | Admitting: Emergency Medicine

## 2018-10-12 DIAGNOSIS — S161XXA Strain of muscle, fascia and tendon at neck level, initial encounter: Secondary | ICD-10-CM | POA: Diagnosis not present

## 2018-10-12 DIAGNOSIS — Y939 Activity, unspecified: Secondary | ICD-10-CM | POA: Insufficient documentation

## 2018-10-12 DIAGNOSIS — R220 Localized swelling, mass and lump, head: Secondary | ICD-10-CM | POA: Diagnosis not present

## 2018-10-12 DIAGNOSIS — M545 Low back pain: Secondary | ICD-10-CM | POA: Diagnosis not present

## 2018-10-12 DIAGNOSIS — M546 Pain in thoracic spine: Secondary | ICD-10-CM | POA: Insufficient documentation

## 2018-10-12 DIAGNOSIS — Y999 Unspecified external cause status: Secondary | ICD-10-CM | POA: Diagnosis not present

## 2018-10-12 DIAGNOSIS — Z79899 Other long term (current) drug therapy: Secondary | ICD-10-CM | POA: Diagnosis not present

## 2018-10-12 DIAGNOSIS — Y92410 Unspecified street and highway as the place of occurrence of the external cause: Secondary | ICD-10-CM | POA: Diagnosis not present

## 2018-10-12 DIAGNOSIS — M549 Dorsalgia, unspecified: Secondary | ICD-10-CM

## 2018-10-12 DIAGNOSIS — S199XXA Unspecified injury of neck, initial encounter: Secondary | ICD-10-CM | POA: Diagnosis present

## 2018-10-12 MED ORDER — ACETAMINOPHEN 500 MG PO TABS
1000.0000 mg | ORAL_TABLET | Freq: Once | ORAL | Status: AC
  Administered 2018-10-12: 1000 mg via ORAL
  Filled 2018-10-12: qty 2

## 2018-10-12 MED ORDER — METHOCARBAMOL 500 MG PO TABS: 500.0000 mg | ORAL_TABLET | Freq: Two times a day (BID) | ORAL | 0 refills | Status: DC
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
## 2018-10-12 NOTE — Discharge Instructions (Signed)
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
## 2023-06-07 ENCOUNTER — Encounter (HOSPITAL_COMMUNITY): Payer: Self-pay | Admitting: Emergency Medicine

## 2023-06-07 ENCOUNTER — Emergency Department (HOSPITAL_COMMUNITY)
Admission: EM | Admit: 2023-06-07 | Discharge: 2023-06-07 | Disposition: A | Discharge status: Home / Self Care | Payer: Self-pay | Attending: Emergency Medicine | Admitting: Emergency Medicine

## 2023-06-07 ENCOUNTER — Other Ambulatory Visit: Payer: Self-pay

## 2023-06-07 DIAGNOSIS — T22211A Burn of second degree of right forearm, initial encounter: Secondary | ICD-10-CM | POA: Insufficient documentation

## 2023-06-07 DIAGNOSIS — Z23 Encounter for immunization: Secondary | ICD-10-CM | POA: Insufficient documentation

## 2023-06-07 DIAGNOSIS — X102XXA Contact with fats and cooking oils, initial encounter: Secondary | ICD-10-CM | POA: Insufficient documentation

## 2023-06-07 MED ORDER — IBUPROFEN 800 MG PO TABS
800.0000 mg | ORAL_TABLET | Freq: Once | ORAL | Status: AC
  Administered 2023-06-07: 800 mg via ORAL
  Filled 2023-06-07: qty 1

## 2023-06-07 MED ORDER — BACITRACIN ZINC 500 UNIT/GM EX OINT
TOPICAL_OINTMENT | Freq: Two times a day (BID) | CUTANEOUS | Status: DC
  Administered 2023-06-07: 1 via TOPICAL
  Filled 2023-06-07: qty 2.7
  Filled 2023-06-07: qty 0.9

## 2023-06-07 MED ORDER — TETANUS-DIPHTH-ACELL PERTUSSIS 5-2.5-18.5 LF-MCG/0.5 IM SUSY
0.5000 mL | PREFILLED_SYRINGE | Freq: Once | INTRAMUSCULAR | Status: AC
  Administered 2023-06-07: 0.5 mL via INTRAMUSCULAR
  Filled 2023-06-07: qty 0.5

## 2023-06-07 MED ORDER — MUPIROCIN CALCIUM 2 % EX CREA: 1.0000 | TOPICAL_CREAM | Freq: Two times a day (BID) | CUTANEOUS | 0 refills | Status: AC

## 2023-06-07 MED ORDER — NAPROXEN 500 MG PO TABS: 500.0000 mg | ORAL_TABLET | Freq: Two times a day (BID) | ORAL | 0 refills | Status: AC

## 2023-06-07 MED ORDER — ACETAMINOPHEN 500 MG PO TABS
1000.0000 mg | ORAL_TABLET | Freq: Once | ORAL | Status: AC
  Administered 2023-06-07: 1000 mg via ORAL
  Filled 2023-06-07: qty 2
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
